# Patient Record
Sex: Male | Born: 2000 | Race: Black or African American | Hispanic: No | Marital: Single | Smoking: Never smoker
Health system: Southern US, Community
[De-identification: ages and names within clinical notes are randomized; demographics above are authoritative.]

## PROBLEM LIST (undated history)

## (undated) DIAGNOSIS — J45909 Unspecified asthma, uncomplicated: Secondary | ICD-10-CM

---
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
## 2019-06-08 ENCOUNTER — Emergency Department (HOSPITAL_COMMUNITY)
Admission: EM | Admit: 2019-06-08 | Discharge: 2019-06-09 | Disposition: A | Discharge status: Home / Self Care | Attending: Emergency Medicine | Admitting: Emergency Medicine

## 2019-06-08 ENCOUNTER — Other Ambulatory Visit: Payer: Self-pay

## 2019-06-08 ENCOUNTER — Encounter (HOSPITAL_COMMUNITY): Payer: Self-pay

## 2019-06-08 DIAGNOSIS — K5900 Constipation, unspecified: Secondary | ICD-10-CM | POA: Insufficient documentation

## 2019-06-08 DIAGNOSIS — R1084 Generalized abdominal pain: Secondary | ICD-10-CM | POA: Insufficient documentation

## 2019-06-08 DIAGNOSIS — J45909 Unspecified asthma, uncomplicated: Secondary | ICD-10-CM | POA: Insufficient documentation

## 2019-06-08 DIAGNOSIS — R110 Nausea: Secondary | ICD-10-CM | POA: Diagnosis present

## 2019-06-08 DIAGNOSIS — Z7251 High risk heterosexual behavior: Secondary | ICD-10-CM | POA: Insufficient documentation

## 2019-06-08 LAB — LIPASE, BLOOD: Lipase: 28 U/L (ref 11–51)

## 2019-06-08 LAB — URINALYSIS, ROUTINE W REFLEX MICROSCOPIC
Bilirubin Urine: NEGATIVE
Glucose, UA: NEGATIVE mg/dL
Hgb urine dipstick: NEGATIVE
Ketones, ur: NEGATIVE mg/dL
Leukocytes,Ua: NEGATIVE
Nitrite: NEGATIVE
Protein, ur: NEGATIVE mg/dL
Specific Gravity, Urine: 1.024 (ref 1.005–1.030)
pH: 6 (ref 5.0–8.0)

## 2019-06-08 LAB — COMPREHENSIVE METABOLIC PANEL
ALT: 18 U/L (ref 0–44)
AST: 25 U/L (ref 15–41)
Albumin: 4.5 g/dL (ref 3.5–5.0)
Alkaline Phosphatase: 91 U/L (ref 38–126)
Anion gap: 8 (ref 5–15)
BUN: 11 mg/dL (ref 6–20)
CO2: 27 mmol/L (ref 22–32)
Calcium: 10.1 mg/dL (ref 8.9–10.3)
Chloride: 101 mmol/L (ref 98–111)
Creatinine, Ser: 0.89 mg/dL (ref 0.61–1.24)
GFR calc Af Amer: >60 mL/min (ref >60)
GFR calc non Af Amer: >60 mL/min (ref >60)
Glucose, Bld: 101 mg/dL — ABNORMAL HIGH (ref 70–99)
Potassium: 4 mmol/L (ref 3.5–5.1)
Sodium: 136 mmol/L (ref 135–145)
Total Bilirubin: 0.7 mg/dL (ref 0.3–1.2)
Total Protein: 7.9 g/dL (ref 6.5–8.1)

## 2019-06-08 LAB — CBC
HCT: 44.1 % (ref 39.0–52.0)
Hemoglobin: 15.4 g/dL (ref 13.0–17.0)
MCH: 31.8 pg (ref 26.0–34.0)
MCHC: 34.9 g/dL (ref 30.0–36.0)
MCV: 90.9 fL (ref 80.0–100.0)
Platelets: 209 10*3/uL (ref 150–400)
RBC: 4.85 MIL/uL (ref 4.22–5.81)
RDW: 11.3 % — ABNORMAL LOW (ref 11.5–15.5)
WBC: 8.4 10*3/uL (ref 4.0–10.5)
nRBC: 0 % (ref 0.0–0.2)

## 2019-06-08 MED ORDER — SODIUM CHLORIDE 0.9% FLUSH: 3.0000 mL | Freq: Once | INTRAVENOUS | Status: DC

## 2019-06-08 NOTE — ED Triage Notes (Signed)
Pt c/o of nausea that has been going on all afternoon, denies vomiting, generalized belly pain, some constipation, pt also requesting STD while he is here

## 2019-06-09 MED ORDER — ONDANSETRON 4 MG PO TBDP: 4.0000 mg | ORAL_TABLET | Freq: Three times a day (TID) | ORAL | 0 refills | Status: AC | PRN

## 2019-06-09 MED ORDER — FAMOTIDINE 20 MG PO TABS: 20.0000 mg | ORAL_TABLET | Freq: Two times a day (BID) | ORAL | 0 refills | Status: AC

## 2019-06-09 MED ORDER — ONDANSETRON 4 MG PO TBDP
8.0000 mg | ORAL_TABLET | Freq: Once | ORAL | Status: AC
  Administered 2019-06-09: 03:00:00 8 mg via ORAL
  Filled 2019-06-09: qty 2

## 2019-06-09 NOTE — ED Provider Notes (Signed)
[facility]PART[facility] Provider Note   CSN: [id number] Arrival date & time: 06/08/19  1935     History Chief Complaint  Patient presents with  . Nausea    Perry Murray Brooke Bonito is a 19 y.o. male.  19 year old male presents to the emergency department for evaluation of ongoing nausea.  He reports waxing and waning symptoms over the past 3 to 4 days.  Nausea is slightly aggravated when in small, enclosed spaces - especially when they are overly heated. Reports some increased nausea with eating, but has been tolerating food and fluids without issue. States he has had some generalized abdominal discomfort as well as constipation. No fevers, sick contacts, vomiting, melena, hematochezia, dysuria, hx of abdominal surgeries. No medications taken PTA.  Also requesting an STD test as a precaution. Asymptomatic, but reports having "a lot of unprotected sex".   The history is provided by the patient. No language interpreter was used.       Past Medical History:  Diagnosis Date  . Asthma     There are no problems to display for this patient.   History reviewed. No pertinent surgical history.     No family history on file.  Social History   Tobacco Use  . Smoking status: Never Smoker  Substance Use Topics  . Alcohol use: No  . Drug use: Not on file    Home Medications Prior to Admission medications   Medication Sig Start Date End Date Taking? Authorizing Provider  famotidine (PEPCID) 20 MG tablet Take 1 tablet (20 mg total) by mouth 2 (two) times daily. 06/09/19   Antonietta Breach, PA-C  ibuprofen (ADVIL,MOTRIN) 100 MG/5ML suspension Take 30 mLs (600 mg total) by mouth every 6 (six) hours as needed for mild pain. 02/25/14   Isaac Bliss, MD  ondansetron (ZOFRAN ODT) 4 MG disintegrating tablet Take 1 tablet (4 mg total) by mouth every 8 (eight) hours as needed for nausea or vomiting. 06/09/19   Antonietta Breach, PA-C    Allergies    Patient has no known  allergies.  Review of Systems   Review of Systems  Ten systems reviewed and are negative for acute change, except as noted in the HPI.    Physical Exam Updated Vital Signs BP 123/65   Pulse 61   Temp 98.1 F (36.7 C) (Oral)   Resp 16   SpO2 100%   Physical Exam Vitals and nursing note reviewed.  Constitutional:      General: He is not in acute distress.    Appearance: He is well-developed. He is not diaphoretic.     Comments: Nontoxic-appearing and in no distress  HENT:     Head: Normocephalic and atraumatic.  Eyes:     General: No scleral icterus.    Conjunctiva/sclera: Conjunctivae normal.  Cardiovascular:     Rate and Rhythm: Normal rate and regular rhythm.     Pulses: Normal pulses.  Pulmonary:     Effort: Pulmonary effort is normal. No respiratory distress.     Comments: Respirations even and unlabored Abdominal:     Comments: Soft, nontender, nondistended abdomen  Musculoskeletal:        General: Normal range of motion.     Cervical back: Normal range of motion.  Skin:    General: Skin is warm and dry.     Coloration: Skin is not pale.     Findings: No erythema or rash.  Neurological:     Mental Status: He is alert and  oriented to person, place, and time.     Coordination: Coordination normal.  Psychiatric:        Behavior: Behavior normal.     ED Results / Procedures / Treatments   Labs (all labs ordered are listed, but only abnormal results are displayed) Labs Reviewed  COMPREHENSIVE METABOLIC PANEL - Abnormal; Notable for the following components:      Result Value   Glucose, Bld 101 (*)    All other components within normal limits  CBC - Abnormal; Notable for the following components:   RDW 11.3 (*)    All other components within normal limits  LIPASE, BLOOD  URINALYSIS, ROUTINE W REFLEX MICROSCOPIC  GC/CHLAMYDIA PROBE AMP () NOT AT [facility]    EKG None  Radiology No results found.  Procedures Procedures (including critical  care time)  Medications Ordered in ED Medications  sodium chloride flush (NS) 0.9 % injection 3 mL (has no administration in time range)  ondansetron (ZOFRAN-ODT) disintegrating tablet 8 mg (8 mg Oral Given 06/09/19 0255)    ED Course  I have reviewed the triage vital signs and the nursing notes.  Pertinent labs & imaging results that were available during my care of the patient were reviewed by me and considered in my medical decision making (see chart for details).    MDM Rules/Calculators/A&P                       19 year old male presenting for evaluation of 3 to 4 days of nausea.  He continues to have bowel movements, though constipated.  Does not report any vomiting.  Is afebrile with stable, reassuring vital signs.  Laboratory evaluation initiated in triage which is without acute findings.  No leukocytosis or electrolyte derangements.  Liver and kidney function preserved.  Normal lipase.  Urinalysis does not suggest infection.  Question GERD versus viral illness versus anxiety related symptoms. His abdomen is soft, nondistended, nontender. Do not feel further emergent work up or imaging is presently indicated. Will treat symptomatically with Zofran PRN. Started on Pepcid BID for questionable indigestion/gastritis. Encouraged PCP follow up. Return precautions discussed and provided. Patient discharged in stable condition with no unaddressed concerns.   Final Clinical Impression(s) / ED Diagnoses Final diagnoses:  Nausea  History of unprotected sex    Rx / DC Orders ED Discharge Orders         Ordered    famotidine (PEPCID) 20 MG tablet  2 times daily     06/09/19 0252    ondansetron (ZOFRAN ODT) 4 MG disintegrating tablet  Every 8 hours PRN     06/09/19 0252           Antony Madura, PA-C 06/09/19 0300    Ward, Layla Maw, DO 06/09/19 [phone]

## 2019-06-09 NOTE — Discharge Instructions (Signed)
Your work-up in the emergency department today was reassuring.  We recommend daily Pepcid as prescribed in addition to Zofran as needed for management of nausea.  Continue to drink plenty of fluids to prevent dehydration.  You can follow-up on the results of your STD test results with the health department in 48 hours.  Results can also be accessed through MyChart.

## 2019-06-09 NOTE — ED Notes (Signed)
Called for room in peds but no answer in lobby.

## 2019-06-10 LAB — GC/CHLAMYDIA PROBE AMP (~~LOC~~) NOT AT ARMC
Chlamydia: NEGATIVE
Neisseria Gonorrhea: NEGATIVE
